# Patient Record
Sex: Female | Born: 1935 | Race: White | Hispanic: No | Smoking: Former smoker
Health system: Southern US, Community
[De-identification: ages and names within clinical notes are randomized; demographics above are authoritative.]

## PROBLEM LIST (undated history)

## (undated) DIAGNOSIS — E079 Disorder of thyroid, unspecified: Secondary | ICD-10-CM

## (undated) DIAGNOSIS — J449 Chronic obstructive pulmonary disease, unspecified: Secondary | ICD-10-CM

## (undated) DIAGNOSIS — I10 Essential (primary) hypertension: Secondary | ICD-10-CM

## (undated) HISTORY — PX: TOTAL HIP ARTHROPLASTY: SHX124

---

## 2010-05-14 ENCOUNTER — Emergency Department (HOSPITAL_COMMUNITY): Payer: Medicare Other

## 2010-05-14 ENCOUNTER — Emergency Department (HOSPITAL_COMMUNITY)
Admission: EM | Admit: 2010-05-14 | Discharge: 2010-05-14 | Disposition: A | Discharge status: Home / Self Care | Payer: Medicare Other | Attending: Emergency Medicine | Admitting: Emergency Medicine

## 2010-05-14 DIAGNOSIS — S0003XA Contusion of scalp, initial encounter: Secondary | ICD-10-CM | POA: Insufficient documentation

## 2010-05-14 DIAGNOSIS — S0990XA Unspecified injury of head, initial encounter: Secondary | ICD-10-CM | POA: Insufficient documentation

## 2010-05-14 DIAGNOSIS — W010XXA Fall on same level from slipping, tripping and stumbling without subsequent striking against object, initial encounter: Secondary | ICD-10-CM | POA: Insufficient documentation

## 2010-05-14 DIAGNOSIS — G319 Degenerative disease of nervous system, unspecified: Secondary | ICD-10-CM | POA: Insufficient documentation

## 2010-05-14 DIAGNOSIS — R51 Headache: Secondary | ICD-10-CM | POA: Insufficient documentation

## 2010-05-14 DIAGNOSIS — M5030 Other cervical disc degeneration, unspecified cervical region: Secondary | ICD-10-CM | POA: Insufficient documentation

## 2010-05-17 ENCOUNTER — Emergency Department (INDEPENDENT_AMBULATORY_CARE_PROVIDER_SITE_OTHER): Payer: Medicare Other

## 2010-05-17 ENCOUNTER — Emergency Department (HOSPITAL_BASED_OUTPATIENT_CLINIC_OR_DEPARTMENT_OTHER)
Admission: EM | Admit: 2010-05-17 | Discharge: 2010-05-17 | Disposition: A | Discharge status: Home / Self Care | Payer: Medicare Other | Attending: Emergency Medicine | Admitting: Emergency Medicine

## 2010-05-17 DIAGNOSIS — W19XXXA Unspecified fall, initial encounter: Secondary | ICD-10-CM

## 2010-05-17 DIAGNOSIS — E039 Hypothyroidism, unspecified: Secondary | ICD-10-CM | POA: Insufficient documentation

## 2010-05-17 DIAGNOSIS — R079 Chest pain, unspecified: Secondary | ICD-10-CM

## 2010-05-17 DIAGNOSIS — R071 Chest pain on breathing: Secondary | ICD-10-CM | POA: Insufficient documentation

## 2010-05-17 DIAGNOSIS — J449 Chronic obstructive pulmonary disease, unspecified: Secondary | ICD-10-CM | POA: Insufficient documentation

## 2010-05-17 DIAGNOSIS — J4489 Other specified chronic obstructive pulmonary disease: Secondary | ICD-10-CM | POA: Insufficient documentation

## 2010-05-17 DIAGNOSIS — I517 Cardiomegaly: Secondary | ICD-10-CM | POA: Insufficient documentation

## 2010-07-20 ENCOUNTER — Ambulatory Visit (HOSPITAL_BASED_OUTPATIENT_CLINIC_OR_DEPARTMENT_OTHER)
Admission: RE | Admit: 2010-07-20 | Discharge: 2010-07-20 | Disposition: A | Discharge status: Home / Self Care | Payer: Medicare Other | Source: Ambulatory Visit | Attending: Specialist | Admitting: Specialist

## 2010-07-20 DIAGNOSIS — Z01812 Encounter for preprocedural laboratory examination: Secondary | ICD-10-CM | POA: Insufficient documentation

## 2010-07-20 DIAGNOSIS — E039 Hypothyroidism, unspecified: Secondary | ICD-10-CM | POA: Insufficient documentation

## 2010-07-20 DIAGNOSIS — H269 Unspecified cataract: Secondary | ICD-10-CM | POA: Insufficient documentation

## 2010-07-20 DIAGNOSIS — Z79899 Other long term (current) drug therapy: Secondary | ICD-10-CM | POA: Insufficient documentation

## 2010-07-20 DIAGNOSIS — J449 Chronic obstructive pulmonary disease, unspecified: Secondary | ICD-10-CM | POA: Insufficient documentation

## 2010-07-20 DIAGNOSIS — J4489 Other specified chronic obstructive pulmonary disease: Secondary | ICD-10-CM | POA: Insufficient documentation

## 2010-07-20 DIAGNOSIS — I4891 Unspecified atrial fibrillation: Secondary | ICD-10-CM | POA: Insufficient documentation

## 2010-07-20 LAB — POCT I-STAT 4, (NA,K, GLUC, HGB,HCT)
Glucose, Bld: 84 mg/dL (ref 70–99)
Potassium: 4 mEq/L (ref 3.5–5.1)
Sodium: 142 mEq/L (ref 135–145)

## 2010-08-31 ENCOUNTER — Ambulatory Visit (HOSPITAL_BASED_OUTPATIENT_CLINIC_OR_DEPARTMENT_OTHER)
Admission: RE | Admit: 2010-08-31 | Discharge: 2010-08-31 | Disposition: A | Discharge status: Home / Self Care | Payer: Medicare Other | Source: Ambulatory Visit | Attending: Specialist | Admitting: Specialist

## 2010-08-31 DIAGNOSIS — K219 Gastro-esophageal reflux disease without esophagitis: Secondary | ICD-10-CM | POA: Insufficient documentation

## 2010-08-31 DIAGNOSIS — J449 Chronic obstructive pulmonary disease, unspecified: Secondary | ICD-10-CM | POA: Insufficient documentation

## 2010-08-31 DIAGNOSIS — H269 Unspecified cataract: Secondary | ICD-10-CM | POA: Insufficient documentation

## 2010-08-31 DIAGNOSIS — I4891 Unspecified atrial fibrillation: Secondary | ICD-10-CM | POA: Insufficient documentation

## 2010-08-31 DIAGNOSIS — J4489 Other specified chronic obstructive pulmonary disease: Secondary | ICD-10-CM | POA: Insufficient documentation

## 2010-08-31 DIAGNOSIS — Z79899 Other long term (current) drug therapy: Secondary | ICD-10-CM | POA: Insufficient documentation

## 2010-08-31 DIAGNOSIS — Z01812 Encounter for preprocedural laboratory examination: Secondary | ICD-10-CM | POA: Insufficient documentation

## 2010-08-31 DIAGNOSIS — I10 Essential (primary) hypertension: Secondary | ICD-10-CM | POA: Insufficient documentation

## 2010-09-01 LAB — POCT I-STAT 4, (NA,K, GLUC, HGB,HCT)
HCT: 43 % (ref 36.0–46.0)
Hemoglobin: 14.6 g/dL (ref 12.0–15.0)

## 2010-10-07 NOTE — Op Note (Signed)
  NAME:  April Trevino, April Trevino                   ACCOUNT NO.:  [id number]  MEDICAL RECORD NO.:  [id number]           PATIENT TYPE:  E  LOCATION:  [facility]                         FACILITY:  [facility]  PHYSICIAN:  Chucky May, M.D.  DATE OF BIRTH:  [birth year]  DATE OF PROCEDURE: DATE OF DISCHARGE:  05/17/2010                              OPERATIVE REPORT   PREOPERATIVE DIAGNOSIS:  Cataract, right eye.  POSTOPERATIVE DIAGNOSIS:  Cataract, right eye.  OPERATION PERFORMED:  Cataract extraction with intraocular lens implant, right eye.  SURGEON:  Chucky May, M.D.  ANESTHESIA:  MAC.  The outpatient setting is the appropriate setting for the procedure.  INDICATIONS FOR SURGERY:  The patient is an 77 year old female with painless progressive decrease in vision so that she has difficulty seeing for activities of daily living including reading.  PROCEDURE:  The patient was brought to the main operating room and placed in the supine position.  Anesthesia was obtained by means of topical 4% lidocaine drops of tetracaine.  She was then prepped and draped in the usual manner.  A lid speculum was inserted, and the cornea was irrigated with balanced salt solution.  An incision was made with the 2.4-mm keratome temporally with an additional port 1 mm with superiorly.  Viscoelastic was instilled into the anterior chamber and an anterior capsulorrhexis was performed without difficulty.  The nucleus was then mobilized by hydrodissection with nonpreserved 1% lidocaine. Next, the nucleus was phacoemulsified and residual cortical material was removed by irrigation and aspiration.  The posterior capsule was polished.  A posterior chamber lens implant was placed in the bag without difficulty.  The serial number for the lens implant was [id number], model number [id number], power 19.5 diopters.  Viscoelastic was removed by irrigation and aspiration and replaced with balanced salt solution until the eye was  normotensive.  The wound was hydrated with balanced salt solution and checked for fluid leaks and none were noted. The eye was dressed with topical Pred Forte and Vigamox and a Fox shield, and the patient was taken to recovery room in excellent condition where she received written and verbal instructions for postoperative care and was scheduled for followup in 24 hours.          ______________________________ Chucky May, M.D.    DJD/MEDQ  D:  07/21/2010  T:  07/21/2010  Job:  [id number]  Electronically Signed by Nelson Chimes M.D. on 10/07/2010 08:53:52 AM

## 2010-10-07 NOTE — Op Note (Signed)
  NAME:  RAJAH, LAMBA                   ACCOUNT NO.:  [id number]  MEDICAL RECORD NO.:  [id number]  LOCATION:                                 FACILITY:  PHYSICIAN:  Chucky May, M.D.  DATE OF BIRTH:  1927/01/30  DATE OF PROCEDURE:  08/31/2010 DATE OF DISCHARGE:                              OPERATIVE REPORT   PREOPERATIVE DIAGNOSIS:  Cataract, left eye.  POSTOPERATIVE DIAGNOSIS:  Cataract, left eye.  OPERATION PERFORMED:  Cataract extraction with intraocular lens implant, left eye.  SURGEON:  Chucky May, MD  ANESTHESIA:  MAC.  The outpatient setting is the appropriate setting for the procedure.  INDICATIONS FOR SURGERY:  The patient is an 77 year old female with painless progressive decrease in vision, so she has difficulty seeing to read.  PROCEDURE IN DETAIL:  The patient was brought to the main operating room and placed in the supine position.  Anesthesia was obtained by means of topical 4% lidocaine drops with tetracaine.  She was then prepped and draped in usual manner.  A lid speculum was inserted and the cornea was entered temporally with an additional port inferiorly.  Viscoelastic was instilled into the anterior chamber and an anterior capsulorrhexis was performed without difficulty.  The nucleus was mobilized by hydrodissection with 1% nonpreserved lidocaine followed by phacoemulsification of the nucleus and removal of residual cortical material by irrigation and aspiration.  The posterior capsule was polished and the posterior chamber lens implant was placed in the bag without difficulty.  Viscoelastic was removed by irrigation and aspiration and replaced with balanced salt solution.  The wounds were hydrated with balanced salt solution and checked for fluid leaks and none were noted.  The eye was dressed with topical Pred Forte, Vigamox, and a Fox shield and the patient was taken to the recovery room in excellent condition where she received written and  verbal instructions for postoperative care and was scheduled for followup in 24 hours.          ______________________________ Chucky May, M.D.     DJD/MEDQ  D:  08/31/2010  T:  08/31/2010  Job:  [id number]  Electronically Signed by Nelson Chimes M.D. on 10/07/2010 08:54:00 AM

## 2013-07-22 ENCOUNTER — Emergency Department (HOSPITAL_BASED_OUTPATIENT_CLINIC_OR_DEPARTMENT_OTHER): Payer: Medicare Other

## 2013-07-22 ENCOUNTER — Encounter (HOSPITAL_BASED_OUTPATIENT_CLINIC_OR_DEPARTMENT_OTHER): Payer: Self-pay | Admitting: Emergency Medicine

## 2013-07-22 ENCOUNTER — Emergency Department (HOSPITAL_BASED_OUTPATIENT_CLINIC_OR_DEPARTMENT_OTHER)
Admission: EM | Admit: 2013-07-22 | Discharge: 2013-07-22 | Disposition: A | Discharge status: Other Acute Inpatient Hospital | Payer: Medicare Other | Attending: Emergency Medicine | Admitting: Emergency Medicine

## 2013-07-22 DIAGNOSIS — J449 Chronic obstructive pulmonary disease, unspecified: Secondary | ICD-10-CM | POA: Insufficient documentation

## 2013-07-22 DIAGNOSIS — Z87891 Personal history of nicotine dependence: Secondary | ICD-10-CM | POA: Insufficient documentation

## 2013-07-22 DIAGNOSIS — R4789 Other speech disturbances: Secondary | ICD-10-CM | POA: Insufficient documentation

## 2013-07-22 DIAGNOSIS — I639 Cerebral infarction, unspecified: Secondary | ICD-10-CM

## 2013-07-22 DIAGNOSIS — Z79899 Other long term (current) drug therapy: Secondary | ICD-10-CM | POA: Insufficient documentation

## 2013-07-22 DIAGNOSIS — J4489 Other specified chronic obstructive pulmonary disease: Secondary | ICD-10-CM | POA: Insufficient documentation

## 2013-07-22 DIAGNOSIS — I6350 Cerebral infarction due to unspecified occlusion or stenosis of unspecified cerebral artery: Secondary | ICD-10-CM | POA: Insufficient documentation

## 2013-07-22 DIAGNOSIS — I10 Essential (primary) hypertension: Secondary | ICD-10-CM | POA: Insufficient documentation

## 2013-07-22 DIAGNOSIS — E079 Disorder of thyroid, unspecified: Secondary | ICD-10-CM | POA: Insufficient documentation

## 2013-07-22 DIAGNOSIS — F29 Unspecified psychosis not due to a substance or known physiological condition: Secondary | ICD-10-CM | POA: Insufficient documentation

## 2013-07-22 DIAGNOSIS — I4892 Unspecified atrial flutter: Secondary | ICD-10-CM | POA: Insufficient documentation

## 2013-07-22 HISTORY — DX: Disorder of thyroid, unspecified: E07.9

## 2013-07-22 HISTORY — DX: Chronic obstructive pulmonary disease, unspecified: J44.9

## 2013-07-22 HISTORY — DX: Essential (primary) hypertension: I10

## 2013-07-22 LAB — COMPREHENSIVE METABOLIC PANEL
ALK PHOS: 91 U/L (ref 39–117)
AST: 15 U/L (ref 0–37)
Albumin: 3.4 g/dL — ABNORMAL LOW (ref 3.5–5.2)
BILIRUBIN TOTAL: 0.5 mg/dL (ref 0.3–1.2)
BUN: 19 mg/dL (ref 6–23)
CALCIUM: 9.6 mg/dL (ref 8.4–10.5)
CO2: 29 meq/L (ref 19–32)
Chloride: 97 mEq/L (ref 96–112)
Creatinine, Ser: 1.1 mg/dL (ref 0.50–1.10)
GFR, EST AFRICAN AMERICAN: 52 mL/min — AB (ref >90)
GFR, EST NON AFRICAN AMERICAN: 44 mL/min — AB (ref >90)
GLUCOSE: 131 mg/dL — AB (ref 70–99)
POTASSIUM: 5 meq/L (ref 3.7–5.3)
SODIUM: 137 meq/L (ref 137–147)
Total Protein: 6.6 g/dL (ref 6.0–8.3)

## 2013-07-22 LAB — DIFFERENTIAL
Basophils Absolute: 0 10*3/uL (ref 0.0–0.1)
Basophils Relative: 0 % (ref 0–1)
EOS PCT: 2 % (ref 0–5)
Eosinophils Absolute: 0.1 10*3/uL (ref 0.0–0.7)
LYMPHS ABS: 0.9 10*3/uL (ref 0.7–4.0)
LYMPHS PCT: 14 % (ref 12–46)
Monocytes Absolute: 0.8 10*3/uL (ref 0.1–1.0)
Monocytes Relative: 13 % — ABNORMAL HIGH (ref 3–12)
Neutro Abs: 4.3 10*3/uL (ref 1.7–7.7)
Neutrophils Relative %: 71 % (ref 43–77)

## 2013-07-22 LAB — URINALYSIS, ROUTINE W REFLEX MICROSCOPIC
BILIRUBIN URINE: NEGATIVE
Glucose, UA: NEGATIVE mg/dL
Hgb urine dipstick: NEGATIVE
KETONES UR: NEGATIVE mg/dL
Leukocytes, UA: NEGATIVE
NITRITE: NEGATIVE
PROTEIN: NEGATIVE mg/dL
Specific Gravity, Urine: 1.007 (ref 1.005–1.030)
UROBILINOGEN UA: 0.2 mg/dL (ref 0.0–1.0)
pH: 7 (ref 5.0–8.0)

## 2013-07-22 LAB — RAPID URINE DRUG SCREEN, HOSP PERFORMED
Amphetamines: NOT DETECTED
BARBITURATES: NOT DETECTED
Benzodiazepines: NOT DETECTED
Cocaine: NOT DETECTED
Opiates: NOT DETECTED
Tetrahydrocannabinol: NOT DETECTED

## 2013-07-22 LAB — CBC
HEMATOCRIT: 41.9 % (ref 36.0–46.0)
Hemoglobin: 13.7 g/dL (ref 12.0–15.0)
MCH: 31.7 pg (ref 26.0–34.0)
MCHC: 32.7 g/dL (ref 30.0–36.0)
MCV: 97 fL (ref 78.0–100.0)
PLATELETS: 179 10*3/uL (ref 150–400)
RBC: 4.32 MIL/uL (ref 3.87–5.11)
RDW: 15 % (ref 11.5–15.5)
WBC: 6.1 10*3/uL (ref 4.0–10.5)

## 2013-07-22 LAB — PROTIME-INR
INR: 0.95 (ref 0.00–1.49)
PROTHROMBIN TIME: 12.5 s (ref 11.6–15.2)

## 2013-07-22 LAB — ETHANOL: Alcohol, Ethyl (B): <11 mg/dL (ref 0–11)

## 2013-07-22 LAB — TROPONIN I: Troponin I: <0.3 ng/mL (ref <0.30)

## 2013-07-22 LAB — APTT: aPTT: 28 seconds (ref 24–37)

## 2013-07-22 NOTE — ED Notes (Signed)
While EDP talking with patient.  Pt was unable to identify a ring and was unable to follow simple commands.  After EDP left, pt was A/O x 4, no confusion, following RN commands to get undressed and moved extremities.

## 2013-07-22 NOTE — ED Notes (Signed)
Confusion. Last seen normal last night by her son. A month ago she had an episode similar that cleared up with time. She is on home oxygen.

## 2013-07-22 NOTE — ED Provider Notes (Addendum)
CSN: [id number]     Arrival date & time 07/22/13  1758 History  This chart was scribed for April Kras, MD by Dorothey Baseman, ED Scribe. This patient was seen in room MH06/MH06 and the patient's care was started at 6:28 PM.    Chief Complaint  Patient presents with  . Altered Mental Status   The history is provided by the patient and a relative (son). The history is limited by the condition of the patient. No language interpreter was used.   HPI Comments: April Trevino is a 80 y.o. female who presents to the Emergency Department complaining of altered mental status including confusion onset earlier today. Her son reports that the patient has been forgetting simple things and has had difficulty finding the right words to express her thoughts. He states that she also slept more than usual today. Her son reports that the patient was last seen at her normal baseline last night. He states that the patient had a similar episode about a month ago that resolved on its own. Patient reports a history of atrial fibrillation, but states that she has not been on anticoagulant medication for several years (approximately 6 years per patient). Patient has a history of COPD, HTN, and thyroid disease.   Past Medical History  Diagnosis Date  . COPD (chronic obstructive pulmonary disease)   . Hypertension   . Thyroid disease    Past Surgical History  Procedure Laterality Date  . Total hip arthroplasty     No family history on file. History  Substance Use Topics  . Smoking status: Former Games developer  . Smokeless tobacco: Not on file  . Alcohol Use: No   OB History   Grav Para Term Preterm Abortions TAB SAB Ect Mult Living                 Review of Systems  Neurological: Positive for speech difficulty.  Psychiatric/Behavioral: Positive for confusion.  All other systems reviewed and are negative.  Allergies  Review of patient's allergies indicates no known allergies.  Home Medications   Prior to Admission  medications   Medication Sig Start Date End Date Taking? Authorizing Provider  carvedilol (COREG) 12.5 MG tablet Take 12.5 mg by mouth 2 (two) times daily with a meal.   Yes Historical Provider, MD  Levothyroxine Sodium (SYNTHROID PO) Take by mouth.   Yes Historical Provider, MD  losartan (COZAAR) 100 MG tablet Take 100 mg by mouth daily.   Yes Historical Provider, MD  tiotropium (SPIRIVA) 18 MCG inhalation capsule Place 18 mcg into inhaler and inhale daily.   Yes Historical Provider, MD   Triage Vitals: BP 130/67  Pulse 97  Temp(Src) 98.6 F (37 C) (Oral)  Resp 20  Ht 5\' 4"  (1.626 m)  Wt 150 lb (68.04 kg)  BMI 25.73 kg/m2  SpO2 92%  Physical Exam  Nursing note and vitals reviewed. Constitutional: She is oriented to person, place, and time. She appears well-developed and well-nourished. No distress.  HENT:  Head: Normocephalic and atraumatic.  Right Ear: External ear normal.  Left Ear: External ear normal.  Mouth/Throat: Oropharynx is clear and moist.  Eyes: Conjunctivae and EOM are normal. Right eye exhibits no discharge. Left eye exhibits no discharge. No scleral icterus.  Neck: Neck supple. No tracheal deviation present.  Cardiovascular: Normal rate and intact distal pulses.  An irregularly irregular rhythm present.  Pulmonary/Chest: Effort normal and breath sounds normal. No stridor. No respiratory distress. She has no wheezes. She has no  rales.  Abdominal: Soft. Bowel sounds are normal. She exhibits no distension. There is no tenderness. There is no rebound and no guarding.  Musculoskeletal: She exhibits no edema and no tenderness.  Neurological: She is alert and oriented to person, place, and time. She has normal strength. No cranial nerve deficit (No facial droop, extraocular movements intact, tongue midline ) or sensory deficit. She exhibits normal muscle tone. She displays no seizure activity. Coordination normal.  No pronator drift bilateral upper extrem, able to hold both  legs off bed for 5 seconds, sensation intact in all extremities, no visual field cuts, no left or right sided neglect, some difficulty comprehending finger-to-nose exam but no obvious past pointing, no nystagmus noted, difficulty with word finding, positive aphasia    Skin: Skin is warm and dry. No rash noted.  Psychiatric: She has a normal mood and affect.    ED Course  Procedures (including critical care time)  DIAGNOSTIC STUDIES: Oxygen Saturation is 92% on room air, adequate by my interpretation.    COORDINATION OF CARE: 6:33 PM- Ordered EKG. Will order head CT, urine drug screen, UA, ethanol, protime-INR, APTT, CBC, differential, CMP, and troponin. Discussed treatment plan with patient and family at bedside and both verbalized agreement.   Labs Review Labs Reviewed  DIFFERENTIAL - Abnormal; Notable for the following:    Monocytes Relative 13 (*)    All other components within normal limits  COMPREHENSIVE METABOLIC PANEL - Abnormal; Notable for the following:    Glucose, Bld 131 (*)    Albumin 3.4 (*)    GFR calc non Af Amer 44 (*)    GFR calc Af Amer 52 (*)    All other components within normal limits  ETHANOL  PROTIME-INR  APTT  CBC  TROPONIN I  URINALYSIS, ROUTINE W REFLEX MICROSCOPIC  URINE RAPID DRUG SCREEN (HOSP PERFORMED)    Imaging Review Ct Head Wo Contrast  07/22/2013   CLINICAL DATA:  Altered mental status  EXAM: CT HEAD WITHOUT CONTRAST  TECHNIQUE: Contiguous axial images were obtained from the base of the skull through the vertex without intravenous contrast.  COMPARISON:  CT HEAD W/O CM dated 05/14/2010; CT C SPINE W/O CM dated 05/14/2010  FINDINGS: Moderate atrophy. Moderate to severe confluent low attenuation in the deep white matter. No hydrocephalus. No evidence of vascular territory infarct. No parenchymal hemorrhage or extra-axial fluid. Calvarium is intact. Visualized portions of the paranasal sinuses are clear. When compared to the prior study, there is no  significant interval change.  IMPRESSION: Severe chronic involutional change.  No acute abnormalities.   Electronically Signed   By: Esperanza Heiraymond  Rubner M.D.   On: 07/22/2013 19:51     EKG Interpretation   Date/Time:  Tuesday Jul 22 2013 18:24:25 EDT Ventricular Rate:  96 PR Interval:    QRS Duration: 86 QT Interval:  344 QTC Calculation: 434 R Axis:   -108 Text Interpretation:  Atrial flutter with variable A-V block Right  superior axis deviation Pulmonary disease pattern Nonspecific ST  abnormality Abnormal QRS-T angle, consider primary T wave abnormality  Abnormal ECG No previous tracing Confirmed by Ramani Riva  MD-J, Karan Inclan ([id number]) on  07/22/2013 6:26:50 PM      MDM   Final diagnoses:  Stroke  Atrial flutter    Symptoms concerning for acute stroke.  Last seen normal however was last night.  Not a code stroke candidate due to the delay in presentation.   Pt has history of atrial flutter.  Not on anticoagulation.  EKG shows recurrent a flutter.  Pt has a cardiologist.   May need to re-evaluate risk benefit of anticoagulation.   Discussed findings with family.  Will admit for further evaluation.  I personally performed the services described in this documentation, which was scribed in my presence.  The recorded information has been reviewed and is accurate.     April KrasJon R Ashly Goethe, MD 07/22/13 2012  Dw Dr Ninfa LindenAhmad.  [facility].  PCP is Dr Donzetta MattersHicks    Neno Hohensee R Larra Crunkleton, MD 07/22/13 2032

## 2013-11-01 ENCOUNTER — Encounter (HOSPITAL_BASED_OUTPATIENT_CLINIC_OR_DEPARTMENT_OTHER): Payer: Self-pay | Admitting: Emergency Medicine

## 2013-11-01 ENCOUNTER — Emergency Department (HOSPITAL_BASED_OUTPATIENT_CLINIC_OR_DEPARTMENT_OTHER)
Admission: EM | Admit: 2013-11-01 | Discharge: 2013-11-01 | Disposition: A | Discharge status: Other Acute Inpatient Hospital | Payer: Medicare Other | Attending: Emergency Medicine | Admitting: Emergency Medicine

## 2013-11-01 ENCOUNTER — Emergency Department (HOSPITAL_BASED_OUTPATIENT_CLINIC_OR_DEPARTMENT_OTHER): Payer: Medicare Other

## 2013-11-01 DIAGNOSIS — Z87891 Personal history of nicotine dependence: Secondary | ICD-10-CM | POA: Insufficient documentation

## 2013-11-01 DIAGNOSIS — J449 Chronic obstructive pulmonary disease, unspecified: Secondary | ICD-10-CM | POA: Diagnosis not present

## 2013-11-01 DIAGNOSIS — I10 Essential (primary) hypertension: Secondary | ICD-10-CM | POA: Insufficient documentation

## 2013-11-01 DIAGNOSIS — R4182 Altered mental status, unspecified: Secondary | ICD-10-CM | POA: Insufficient documentation

## 2013-11-01 DIAGNOSIS — E079 Disorder of thyroid, unspecified: Secondary | ICD-10-CM | POA: Insufficient documentation

## 2013-11-01 DIAGNOSIS — G459 Transient cerebral ischemic attack, unspecified: Secondary | ICD-10-CM | POA: Diagnosis not present

## 2013-11-01 DIAGNOSIS — Z79899 Other long term (current) drug therapy: Secondary | ICD-10-CM | POA: Diagnosis not present

## 2013-11-01 DIAGNOSIS — J4489 Other specified chronic obstructive pulmonary disease: Secondary | ICD-10-CM | POA: Insufficient documentation

## 2013-11-01 LAB — COMPREHENSIVE METABOLIC PANEL
ALBUMIN: 3.8 g/dL (ref 3.5–5.2)
ALK PHOS: 100 U/L (ref 39–117)
ALT: 14 U/L (ref 0–35)
AST: 16 U/L (ref 0–37)
Anion gap: 11 (ref 5–15)
BUN: 16 mg/dL (ref 6–23)
CHLORIDE: 95 meq/L — AB (ref 96–112)
CO2: 31 mEq/L (ref 19–32)
Calcium: 10 mg/dL (ref 8.4–10.5)
Creatinine, Ser: 0.9 mg/dL (ref 0.50–1.10)
GFR calc Af Amer: 66 mL/min — ABNORMAL LOW (ref >90)
GFR calc non Af Amer: 57 mL/min — ABNORMAL LOW (ref >90)
GLUCOSE: 112 mg/dL — AB (ref 70–99)
POTASSIUM: 4.4 meq/L (ref 3.7–5.3)
Sodium: 137 mEq/L (ref 137–147)
TOTAL PROTEIN: 7.3 g/dL (ref 6.0–8.3)
Total Bilirubin: 0.8 mg/dL (ref 0.3–1.2)

## 2013-11-01 LAB — CBC
HCT: 44.1 % (ref 36.0–46.0)
HEMOGLOBIN: 14.4 g/dL (ref 12.0–15.0)
MCH: 33 pg (ref 26.0–34.0)
MCHC: 32.7 g/dL (ref 30.0–36.0)
MCV: 101.1 fL — ABNORMAL HIGH (ref 78.0–100.0)
Platelets: 169 10*3/uL (ref 150–400)
RBC: 4.36 MIL/uL (ref 3.87–5.11)
RDW: 14 % (ref 11.5–15.5)
WBC: 6.5 10*3/uL (ref 4.0–10.5)

## 2013-11-01 LAB — RAPID URINE DRUG SCREEN, HOSP PERFORMED
Amphetamines: NOT DETECTED
Barbiturates: NOT DETECTED
Benzodiazepines: NOT DETECTED
Cocaine: NOT DETECTED
OPIATES: NOT DETECTED
Tetrahydrocannabinol: NOT DETECTED

## 2013-11-01 LAB — ETHANOL

## 2013-11-01 LAB — URINALYSIS, ROUTINE W REFLEX MICROSCOPIC
BILIRUBIN URINE: NEGATIVE
GLUCOSE, UA: NEGATIVE mg/dL
HGB URINE DIPSTICK: NEGATIVE
KETONES UR: NEGATIVE mg/dL
LEUKOCYTES UA: NEGATIVE
Nitrite: NEGATIVE
PH: 8 (ref 5.0–8.0)
Protein, ur: NEGATIVE mg/dL
Specific Gravity, Urine: 1.006 (ref 1.005–1.030)
Urobilinogen, UA: 0.2 mg/dL (ref 0.0–1.0)

## 2013-11-01 LAB — PROTIME-INR
INR: 1.09 (ref 0.00–1.49)
Prothrombin Time: 14.1 seconds (ref 11.6–15.2)

## 2013-11-01 LAB — TROPONIN I: Troponin I: <0.3 ng/mL (ref <0.30)

## 2013-11-01 LAB — APTT: APTT: 30 s (ref 24–37)

## 2013-11-01 NOTE — ED Provider Notes (Signed)
CSN: [id number]     Arrival date & time 11/01/13  1304 History   First MD Initiated Contact with Patient 11/01/13 1312     Chief Complaint  Patient presents with  . Altered Mental Status     (Consider location/radiation/quality/duration/timing/severity/associated sxs/prior Treatment) HPI 80 year old female presents with altered mental status that resolved. Approximately 2 hours ago she was noted to have confusion, aphasia, and a left facial droop. The patient also had blurry vision. Symptoms resolved on the car ride over here. The niece states that the patient had similar but not as severe symptoms about a week ago. She was complaining of blurry vision while driving and the daughter noticed a slight facial droop on the left side at that time as well. The patient had a stroke 3 months ago and was admitted [facility]. The patient has since been placed on Eliquis. At this time the patient is completely asymptomatic including normal vision. She is alert and oriented. No complaints of weakness.  Past Medical History  Diagnosis Date  . COPD (chronic obstructive pulmonary disease)   . Hypertension   . Thyroid disease    Past Surgical History  Procedure Laterality Date  . Total hip arthroplasty     No family history on file. History  Substance Use Topics  . Smoking status: Former Games developermoker  . Smokeless tobacco: Not on file  . Alcohol Use: No   OB History   Grav Para Term Preterm Abortions TAB SAB Ect Mult Living                 Review of Systems  Eyes: Positive for visual disturbance.  Neurological: Positive for facial asymmetry and speech difficulty. Negative for weakness.  All other systems reviewed and are negative.     Allergies  Review of patient's allergies indicates no known allergies.  Home Medications   Prior to Admission medications   Medication Sig Start Date End Date Taking? Authorizing Provider  apixaban (ELIQUIS) 2.5 MG TABS tablet Take 2.5 mg by mouth  2 (two) times daily.   Yes Historical Provider, MD  carvedilol (COREG) 12.5 MG tablet Take 12.5 mg by mouth 2 (two) times daily with a meal.    Historical Provider, MD  Levothyroxine Sodium (SYNTHROID PO) Take by mouth.    Historical Provider, MD  losartan (COZAAR) 100 MG tablet Take 100 mg by mouth daily.    Historical Provider, MD  tiotropium (SPIRIVA) 18 MCG inhalation capsule Place 18 mcg into inhaler and inhale daily.    Historical Provider, MD   BP 192/74  Pulse 82  Temp(Src) 98.4 F (36.9 C) (Oral)  Resp 20  Ht 5\' 4"  (1.626 m)  Wt 145 lb (65.772 kg)  BMI 24.88 kg/m2  SpO2 93% Physical Exam  Nursing note and vitals reviewed. Constitutional: She is oriented to person, place, and time. She appears well-developed and well-nourished.  HENT:  Head: Normocephalic and atraumatic.  Right Ear: External ear normal.  Left Ear: External ear normal.  Nose: Nose normal.  Eyes: EOM are normal. Pupils are equal, round, and reactive to light. Right eye exhibits no discharge. Left eye exhibits no discharge.  Cardiovascular: Normal rate, regular rhythm and normal heart sounds.   Pulmonary/Chest: Effort normal and breath sounds normal.  Abdominal: Soft. She exhibits no distension. There is no tenderness.  Neurological: She is alert and oriented to person, place, and time.  CN 2-12 grossly intact. 5/5 strength in all 4 extremities.  Skin: Skin is warm and  dry.    ED Course  Procedures (including critical care time) Labs Review Labs Reviewed  CBC - Abnormal; Notable for the following:    MCV 101.1 (*)    All other components within normal limits  COMPREHENSIVE METABOLIC PANEL - Abnormal; Notable for the following:    Chloride 95 (*)    Glucose, Bld 112 (*)    GFR calc non Af Amer 57 (*)    GFR calc Af Amer 66 (*)    All other components within normal limits  PROTIME-INR  ETHANOL  APTT  URINE RAPID DRUG SCREEN (HOSP PERFORMED)  URINALYSIS, ROUTINE W REFLEX MICROSCOPIC  TROPONIN I     Imaging Review Ct Head Wo Contrast  11/01/2013   CLINICAL DATA:  Mental status change, visual change, and difficulty speaking  EXAM: CT HEAD WITHOUT CONTRAST  TECHNIQUE: Contiguous axial images were obtained from the base of the skull through the vertex without intravenous contrast.  COMPARISON:  Noncontrast CT scan of the brain of Jul 23, 2013 and CT angiogram of the brain of the same day.  FINDINGS: There is stable mild diffuse cerebral and cerebellar atrophy with compensatory ventriculomegaly. There is extensive deep white matter hypodensity in both cerebral hemispheres consistent with chronic small vessel ischemic change. Old lacunar infarctions in the rightinsula and in the basal ganglia bilaterally are present. There is no acute intracranial hemorrhage nor evidence of an acute territorial infarction. The cerebellum and brainstem exhibit no acute abnormalities.  IMPRESSION: 1. There is no acute intracranial hemorrhage nor evidence of an evolving ischemic infarction. 2. There are extensive changes of chronic small vessel ischemia with previous lacunar infarctions. 3. There is stable mild diffuse atrophy with compensatory ventriculomegaly.   Electronically Signed   By: David  [place]   On: 11/01/2013 14:18     EKG Interpretation   Date/Time:  Saturday November 01 2013 13:20:13 EDT Ventricular Rate:  80 PR Interval:    QRS Duration: 122 QT Interval:  324 QTC Calculation: 373 R Axis:   119 Text Interpretation:  Atrial flutter with variable A-V block with  premature ventricular or aberrantly conducted complexes Right bundle  branch block Anteroseptal infarct , age undetermined Abnormal ECG No  significant change since May 2015 Confirmed by Criss Alvine  MD, Stanford Strauch ([id number])  on 11/01/2013 1:25:10 PM      MDM   Final diagnoses:  Transient cerebral ischemia, unspecified transient cerebral ischemia type    Patient symptoms are consistent with a TIA. At this time her neurologic exam is returned to  normal. She does appear to have a possible small facial droop on the left but is otherwise asymptomatic. After discussion with family they prefer to be observed at [facility]. Discussed with the hospitalist, Dr. Christella Noa who will admit and accepts in transfer.    Audree Camel, MD 11/01/13 [phone]

## 2013-11-01 NOTE — ED Notes (Signed)
Family reports that they were shopping and pt had a moment of not remembering anything. Sts that she had garbled/heavy speech at that time.  Pt alert and oriented x 4 at this time. C/o no pain.

## 2013-11-01 NOTE — ED Notes (Signed)
Assumed care of patient from Steve, RN.  

## 2016-03-12 IMAGING — CT CT HEAD W/O CM
1 series · 16 of 30 positions shown, 20 images · non-contrast
Comparison: CT HEAD W/O CM dated 05/14/2010; CT C SPINE W/O CM dated
05/14/2010

CLINICAL DATA: Altered mental status

EXAM:
CT HEAD WITHOUT CONTRAST
TECHNIQUE: Contiguous axial images were obtained from the base of the skull
through the vertex without intravenous contrast.

[Series 2: head 4.8 h37s · axial · 0.44mm/px · z∈[-144,-4]mm · 16 of 32 slices shown, 20 images]
[im 2/32  brain]
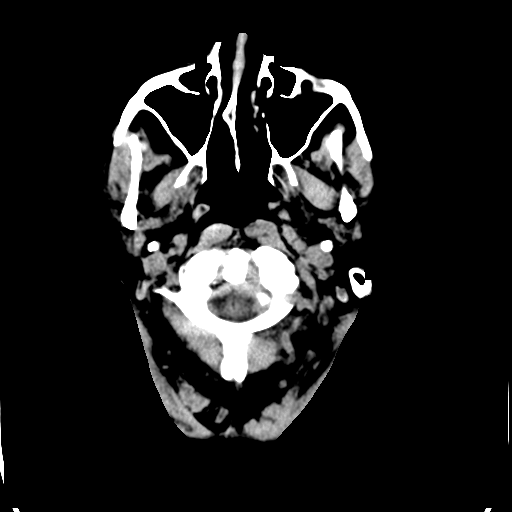
[im 2/32  bone]
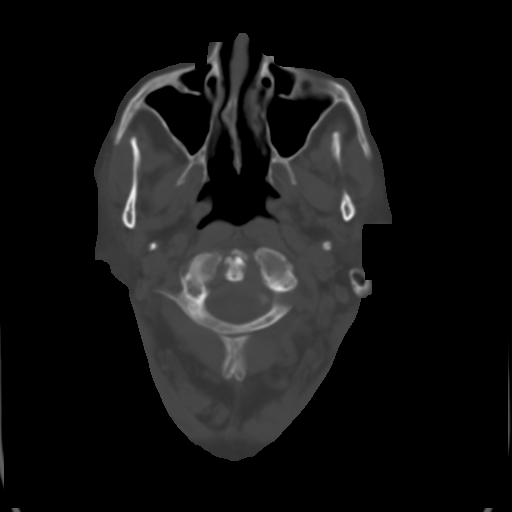
[im 4/32  brain]
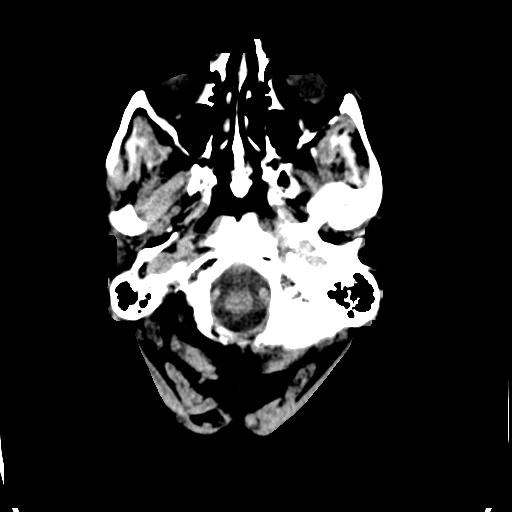
[im 6/32  brain]
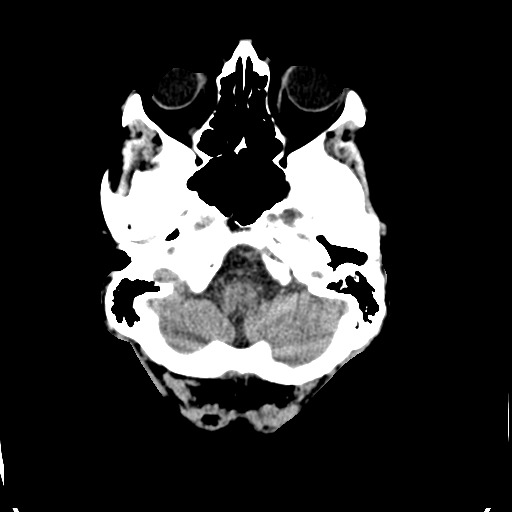
[im 8/32  brain]
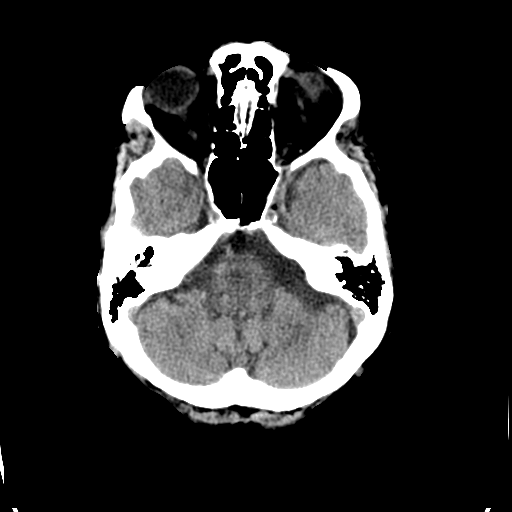
[im 9/32  brain]
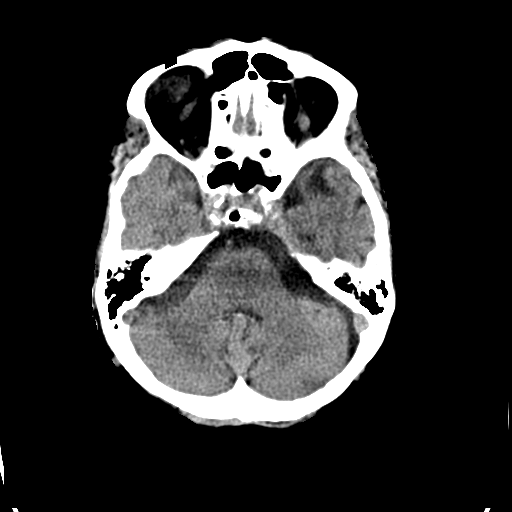
[im 9/32  bone]
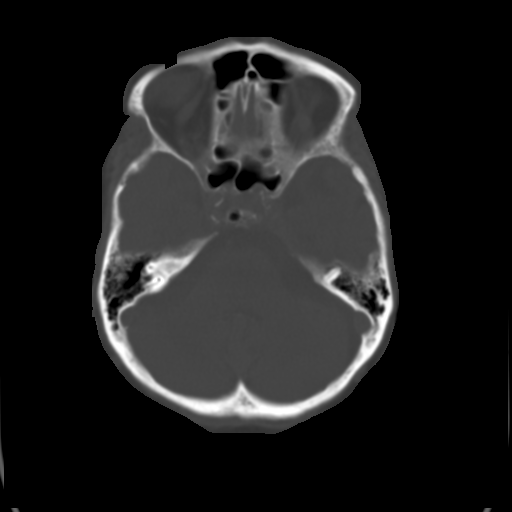
[im 11/32  brain]
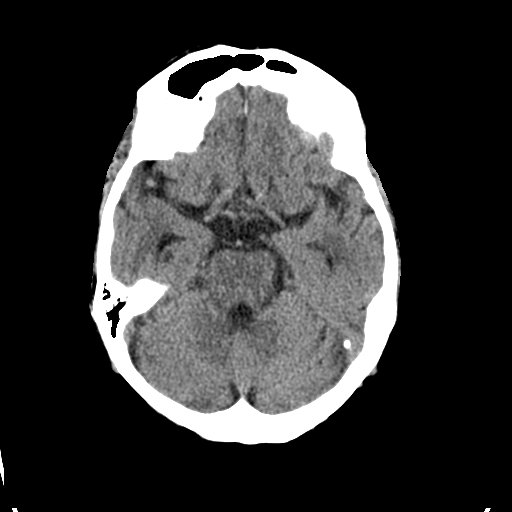
[im 13/32  brain]
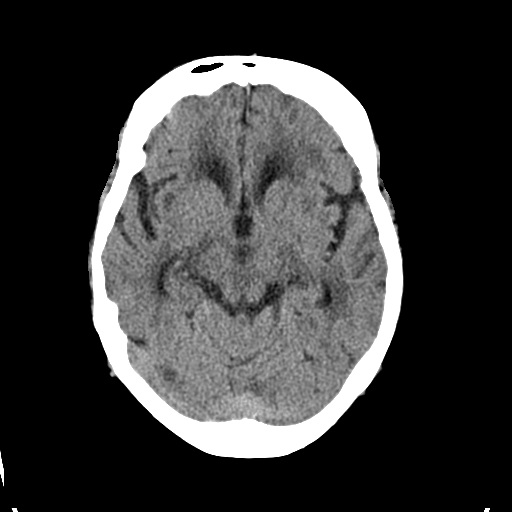
[im 15/32  brain]
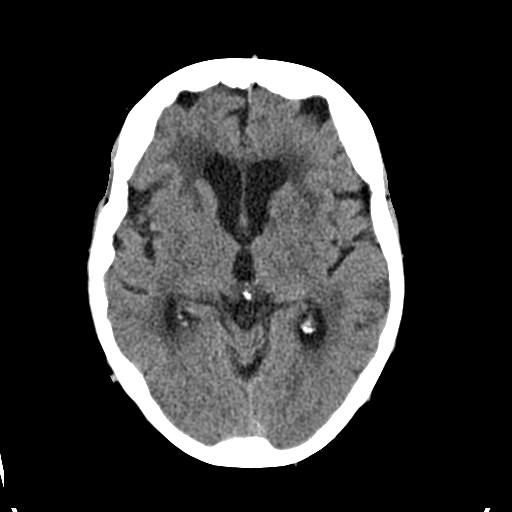
[im 17/32  brain]
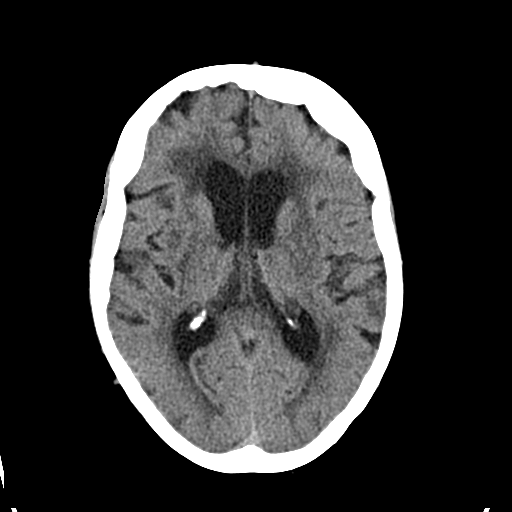
[im 17/32  bone]
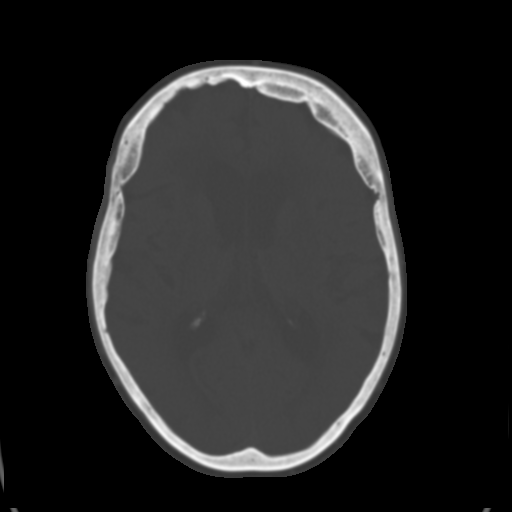
[im 19/32  brain]
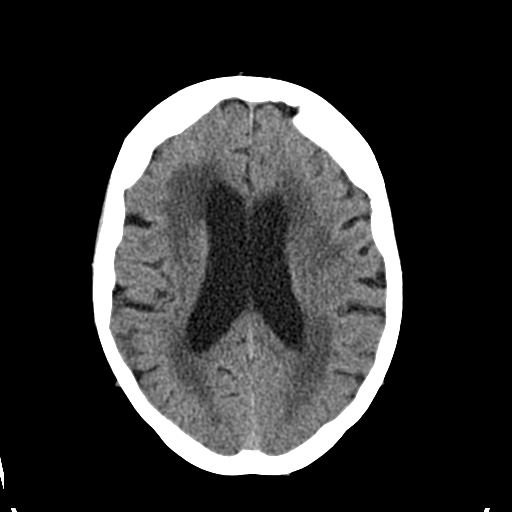
[im 21/32  brain]
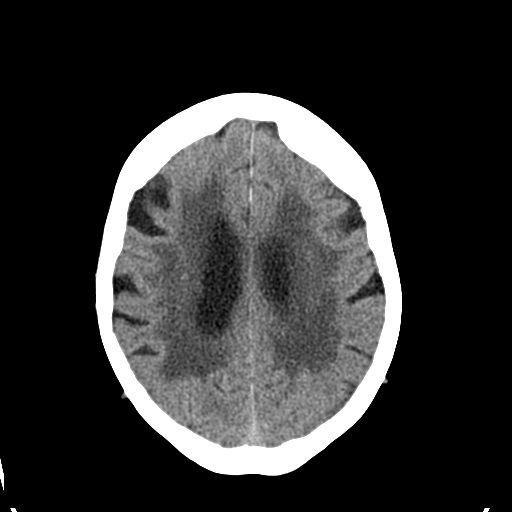
[im 23/32  brain]
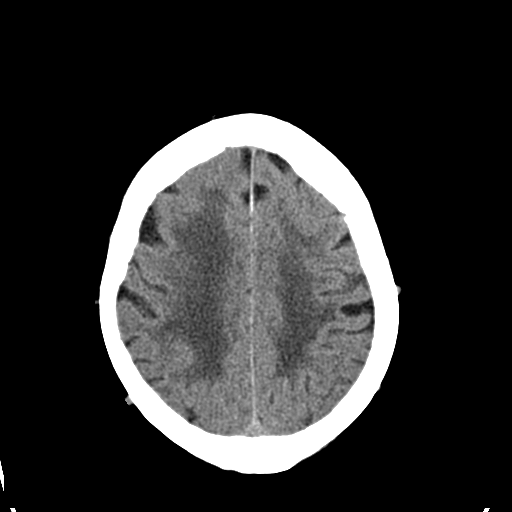
[im 24/32  brain]
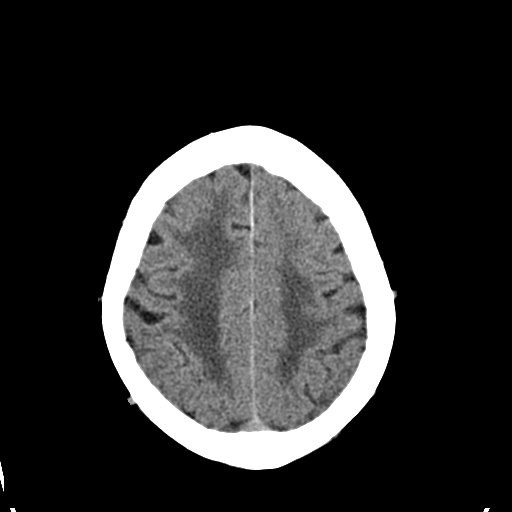
[im 24/32  bone]
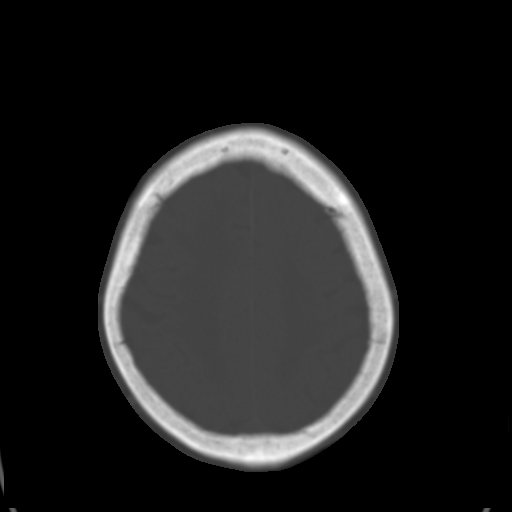
[im 26/32  brain]
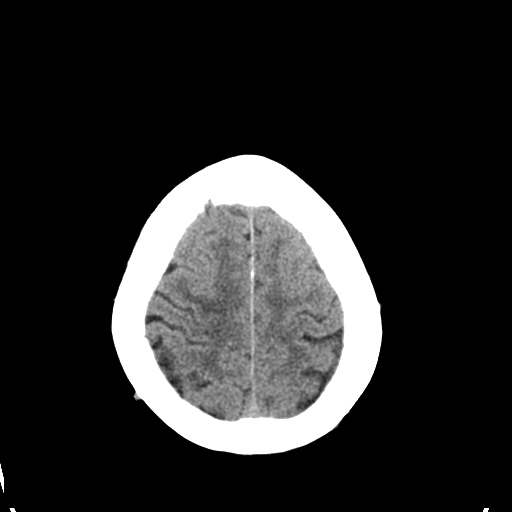
[im 28/32  brain]
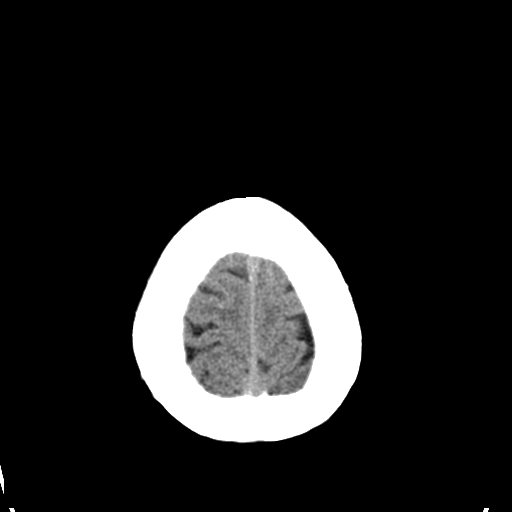
[im 30/32  brain]
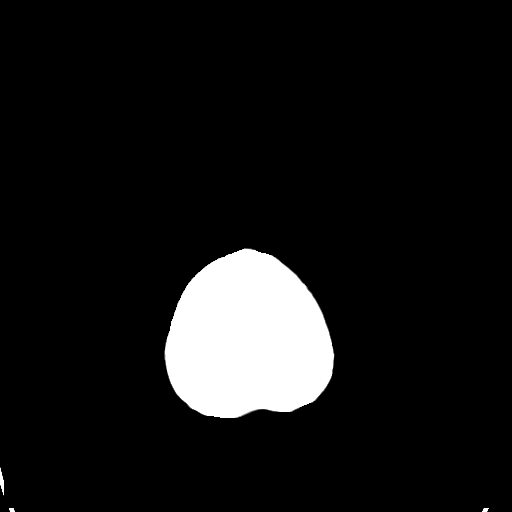

[16 of 30 positions shown; findings below may reference images not displayed]

FINDINGS: Moderate atrophy. Moderate to severe confluent low attenuation in
the deep white matter. No hydrocephalus. No evidence of vascular
territory infarct. No parenchymal hemorrhage or extra-axial fluid.
Calvarium is intact. Visualized portions of the paranasal sinuses
are clear. When compared to the prior study, there is no significant
interval change.
IMPRESSION: Severe chronic involutional change.  No acute abnormalities.

## 2016-06-22 IMAGING — CT CT HEAD W/O CM
1 series · 15 of 30 positions shown, 19 images · non-contrast
Comparison: Noncontrast CT scan of the brain July 23, 2013 and CT
angiogram of the brain of the same day.

CLINICAL DATA: Mental status change, visual change, and difficulty
speaking

EXAM:
CT HEAD WITHOUT CONTRAST
TECHNIQUE: Contiguous axial images were obtained from the base of the skull
through the vertex without intravenous contrast.

[Series 2: head 4.8 h37s · axial · 0.48mm/px · z∈[-129,+8]mm · 15 of 32 slices shown, 19 images]
[im 2/32  brain]
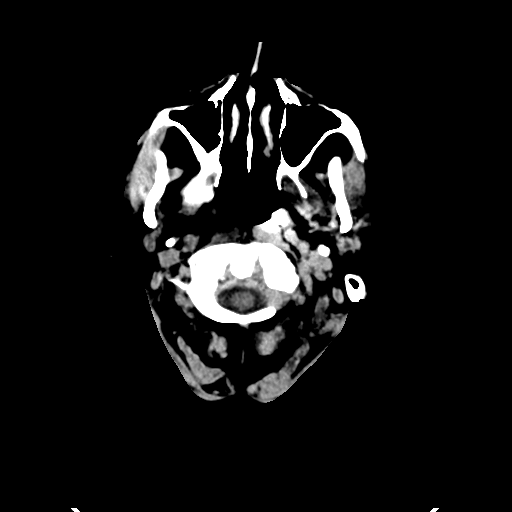
[im 2/32  bone]
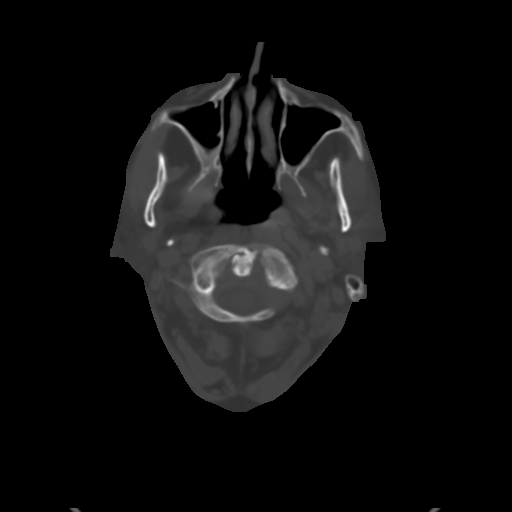
[im 4/32  brain]
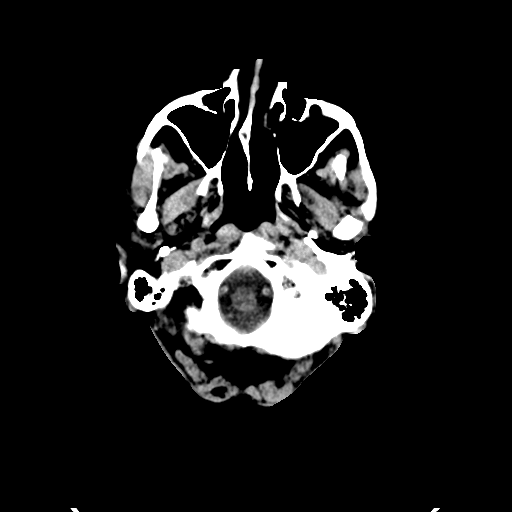
[im 6/32  brain]
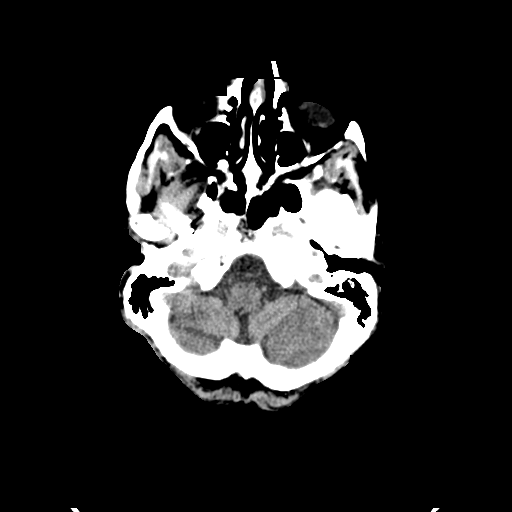
[im 8/32  brain]
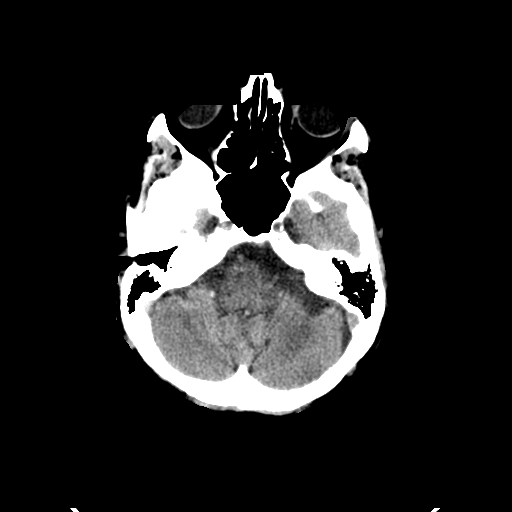
[im 10/32  brain]
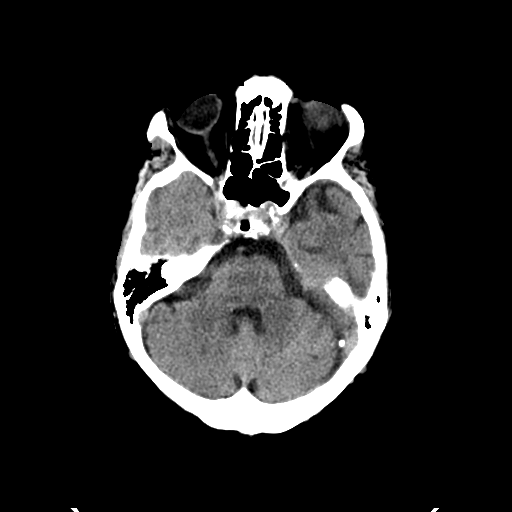
[im 10/32  bone]
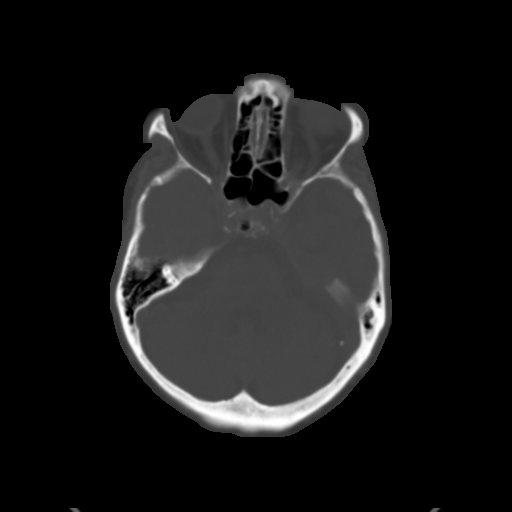
[im 12/32  brain]
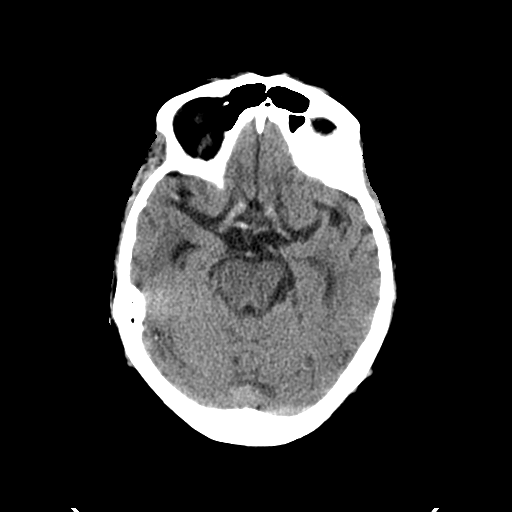
[im 14/32  brain]
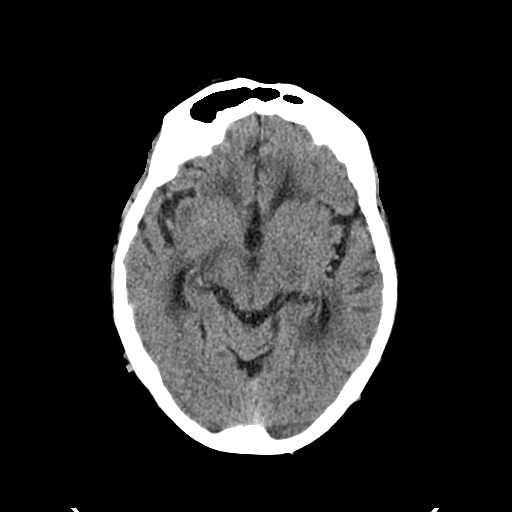
[im 17/32  brain]
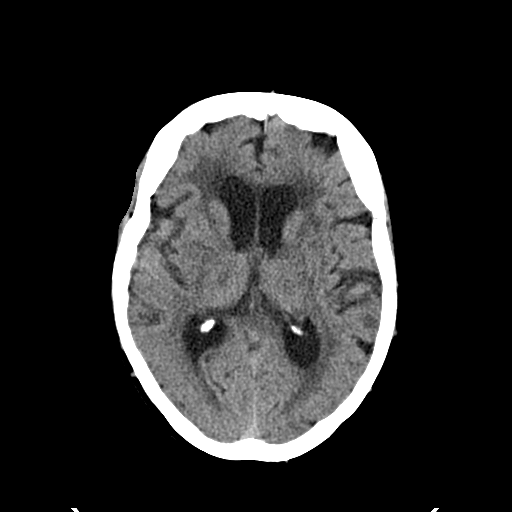
[im 18/32  brain]
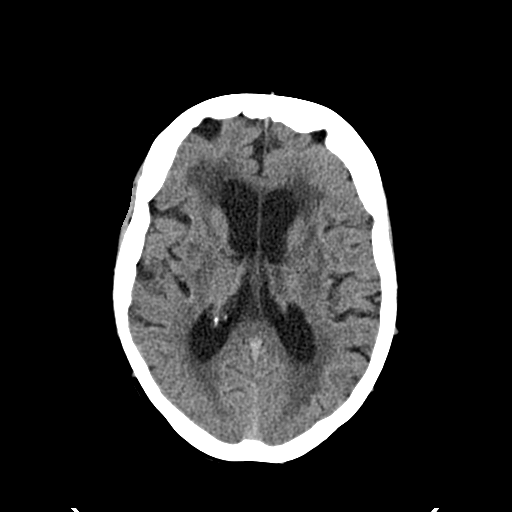
[im 18/32  bone]
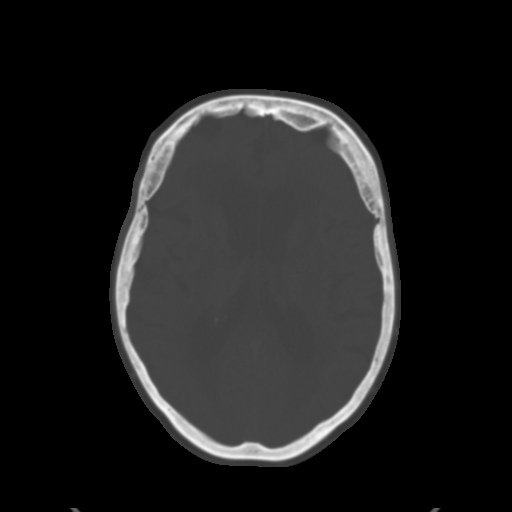
[im 20/32  brain]
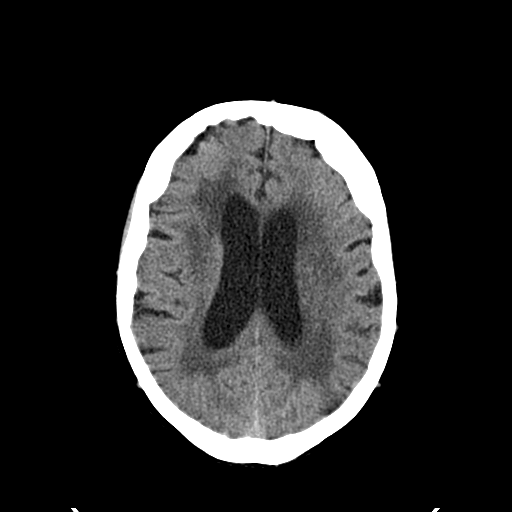
[im 22/32  brain]
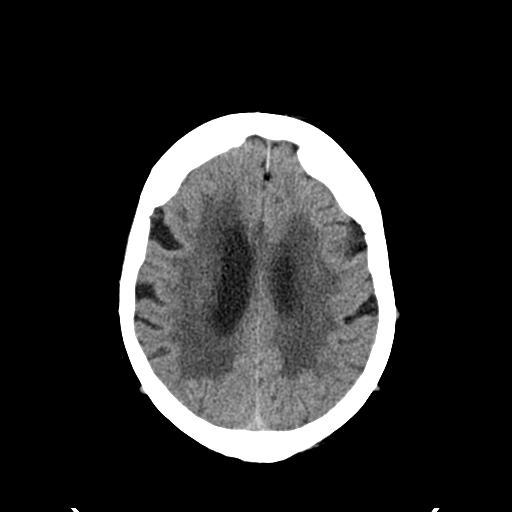
[im 24/32  brain]
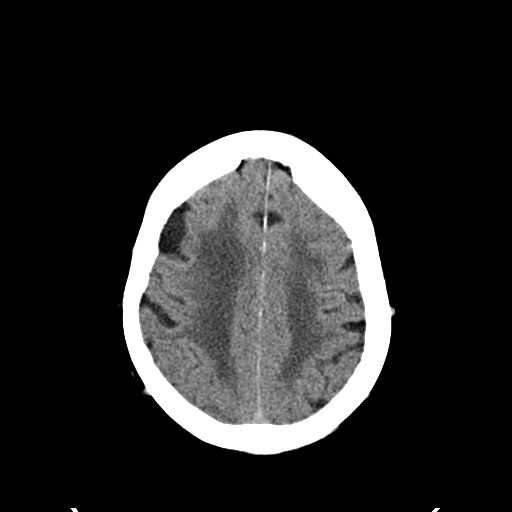
[im 26/32  brain]
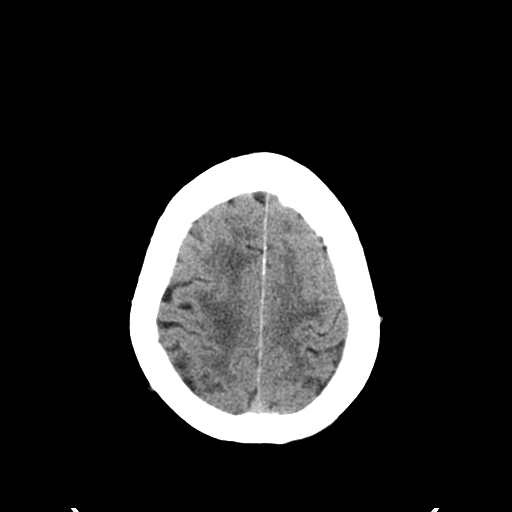
[im 26/32  bone]
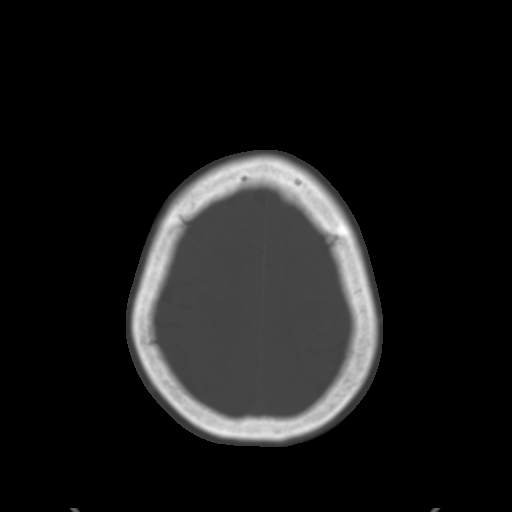
[im 28/32  brain]
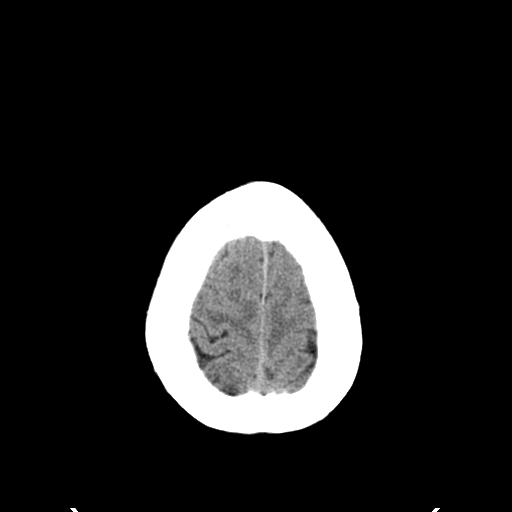
[im 30/32  brain]
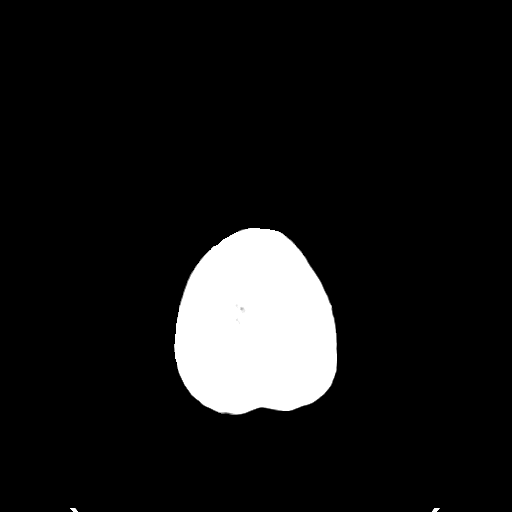

[15 of 30 positions shown; findings below may reference images not displayed]

FINDINGS: There is stable mild diffuse cerebral and cerebellar atrophy with
compensatory ventriculomegaly. There is extensive deep white matter
hypodensity in both cerebral hemispheres consistent with chronic
small vessel ischemic change. Old lacunar infarctions in the
rightinsula and in the basal ganglia bilaterally are present. There
is no acute intracranial hemorrhage nor evidence of an acute
territorial infarction. The cerebellum and brainstem exhibit no
acute abnormalities.
IMPRESSION: 1. There is no acute intracranial hemorrhage nor evidence of an
evolving ischemic infarction.
2. There are extensive changes of chronic small vessel ischemia with
previous lacunar infarctions.
3. There is stable mild diffuse atrophy with compensatory
ventriculomegaly.

## 2021-10-11 DEATH — deceased
